# Patient Record
Sex: Male | Born: 1990 | Race: White | Hispanic: No | Marital: Single | Smoking: Former smoker
Health system: Southern US, Community
[De-identification: ages and names within clinical notes are randomized; demographics above are authoritative.]

---
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
## 2015-07-03 ENCOUNTER — Ambulatory Visit
Admission: EM | Admit: 2015-07-03 | Discharge: 2015-07-03 | Disposition: A | Discharge status: Home / Self Care | Payer: BLUE CROSS/BLUE SHIELD | Attending: Family Medicine | Admitting: Family Medicine

## 2015-07-03 DIAGNOSIS — J020 Streptococcal pharyngitis: Secondary | ICD-10-CM | POA: Diagnosis not present

## 2015-07-03 LAB — RAPID STREP SCREEN (MED CTR MEBANE ONLY): Streptococcus, Group A Screen (Direct): POSITIVE — AB

## 2015-07-03 MED ORDER — LIDOCAINE VISCOUS 2 % MT SOLN: OROMUCOSAL | Status: AC

## 2015-07-03 MED ORDER — PENICILLIN V POTASSIUM 500 MG PO TABS: 500.0000 mg | ORAL_TABLET | Freq: Three times a day (TID) | ORAL | Status: AC

## 2015-07-03 NOTE — ED Notes (Signed)
Pt reporting sore throat, cold chills, headache, swollen lymph nodes.  Ongoing for a week when lymph nodes were noticed to be swollen.  Other symptoms since 2 nights ago.   Denies any sick contacts.   Unknown fevers - didn't take own temp.

## 2015-07-03 NOTE — ED Provider Notes (Signed)
CSN: [id number]     Arrival date & time 07/03/15  1542 History   None    Chief Complaint  Patient presents with  . Chills  . Sore Throat  . Headache   (Consider location/radiation/quality/duration/timing/severity/associated sxs/prior Treatment) HPI Comments: 24 yo male with a 3 days h/o sore throat, pain with swallowing, chills and swollen lymph node on the left neck.Denies any earache, cough, nasal congestion, runny nose.   The history is provided by the patient.    History reviewed. No pertinent past medical history. History reviewed. No pertinent past surgical history. History reviewed. No pertinent family history. Social History  Substance Use Topics  . Smoking status: Former Games developer  . Smokeless tobacco: None     Comment: e-cigs  . Alcohol Use: Yes     Comment: socially    Review of Systems  Allergies  Review of patient's allergies indicates no known allergies.  Home Medications   Prior to Admission medications   Medication Sig Start Date End Date Taking? Authorizing Provider  lidocaine (XYLOCAINE) 2 % solution 20ml po gargle and spit q 6 hours prn 07/03/15   Payton Mccallum, MD  penicillin v potassium (VEETID) 500 MG tablet Take 1 tablet (500 mg total) by mouth 3 (three) times daily. 07/03/15   Payton Mccallum, MD   Meds Ordered and Administered this Visit  Medications - No data to display  BP 116/85 mmHg  Pulse 77  Temp(Src) 99.8 F (37.7 C) (Oral)  Resp 16  Wt 145 lb (65.772 kg)  SpO2 97% No data found.   Physical Exam  Constitutional: He appears well-developed and well-nourished. No distress.  HENT:  Head: Normocephalic and atraumatic.  Right Ear: Tympanic membrane, external ear and ear canal normal.  Left Ear: Tympanic membrane, external ear and ear canal normal.  Nose: Nose normal.  Mouth/Throat: Uvula is midline and mucous membranes are normal. Oropharyngeal exudate and posterior oropharyngeal erythema present. No posterior oropharyngeal edema or  tonsillar abscesses.  Eyes: Conjunctivae and EOM are normal. Pupils are equal, round, and reactive to light. Right eye exhibits no discharge. Left eye exhibits no discharge. No scleral icterus.  Neck: Normal range of motion. Neck supple. No tracheal deviation present. No thyromegaly present.  Cardiovascular: Normal rate, regular rhythm and normal heart sounds.   Pulmonary/Chest: Effort normal and breath sounds normal. No stridor. No respiratory distress. He has no wheezes. He has no rales. He exhibits no tenderness.  Lymphadenopathy:    He has no cervical adenopathy.  Neurological: He is alert.  Skin: Skin is warm. No rash noted. He is not diaphoretic.  Nursing note and vitals reviewed.   ED Course  Procedures (including critical care time)  Labs Review Labs Reviewed  RAPID STREP SCREEN (NOT AT [facility]) - Abnormal; Notable for the following:    Streptococcus, Group A Screen (Direct) POSITIVE (*)    All other components within normal limits    Imaging Review No results found.   Visual Acuity Review  Right Eye Distance:   Left Eye Distance:   Bilateral Distance:    Right Eye Near:   Left Eye Near:    Bilateral Near:         MDM   1. Strep pharyngitis    Discharge Medication List as of 07/03/2015  4:50 PM    START taking these medications   Details  lidocaine (XYLOCAINE) 2 % solution 20ml po gargle and spit q 6 hours prn, Normal    penicillin v potassium (VEETID) 500 MG  tablet Take 1 tablet (500 mg total) by mouth 3 (three) times daily., Starting 07/03/2015, Until Discontinued, Normal      1. Lab results and diagnosis reviewed with patient 2. rx as per orders above; reviewed possible side effects, interactions, risks and benefits  3. Recommend supportive treatment with otc analgesics prn, gargles 4. Follow prn if symptoms worsen or don't improve    Payton Mccallumrlando Tristin Vandeusen, MD 07/03/15 1724

## 2015-07-03 NOTE — ED Notes (Signed)
Non-toxic appearing pt.  Reporting throat pain x1 week.  Reports swollen lymph nodes, increased pain upon palpation.   Pain with swallowing.  No issues breathing.

## 2015-07-03 NOTE — Discharge Instructions (Signed)
DCd by MD.
